# Patient Record
Sex: Female | Born: 1965 | Hispanic: Yes | Marital: Married | Smoking: Never smoker
Health system: Southern US, Community
[De-identification: ages and names within clinical notes are randomized; demographics above are authoritative.]

## PROBLEM LIST (undated history)

## (undated) DIAGNOSIS — E119 Type 2 diabetes mellitus without complications: Secondary | ICD-10-CM

## (undated) DIAGNOSIS — I10 Essential (primary) hypertension: Secondary | ICD-10-CM

## (undated) HISTORY — PX: COLPOSCOPY: SHX161

---

## 2011-11-11 ENCOUNTER — Other Ambulatory Visit (HOSPITAL_COMMUNITY): Payer: Self-pay | Admitting: Family Medicine

## 2011-11-11 DIAGNOSIS — Z1231 Encounter for screening mammogram for malignant neoplasm of breast: Secondary | ICD-10-CM

## 2011-12-09 ENCOUNTER — Ambulatory Visit (HOSPITAL_COMMUNITY): Payer: Self-pay

## 2011-12-10 ENCOUNTER — Ambulatory Visit (HOSPITAL_COMMUNITY): Admission: RE | Admit: 2011-12-10 | Payer: Self-pay | Source: Ambulatory Visit

## 2012-02-27 ENCOUNTER — Ambulatory Visit (HOSPITAL_COMMUNITY)
Admission: RE | Admit: 2012-02-27 | Discharge: 2012-02-27 | Disposition: A | Discharge status: Home / Self Care | Payer: Self-pay | Source: Ambulatory Visit | Attending: Family Medicine | Admitting: Family Medicine

## 2012-02-27 ENCOUNTER — Ambulatory Visit (HOSPITAL_COMMUNITY): Payer: Self-pay

## 2012-02-27 DIAGNOSIS — Z1231 Encounter for screening mammogram for malignant neoplasm of breast: Secondary | ICD-10-CM

## 2013-03-09 ENCOUNTER — Other Ambulatory Visit (HOSPITAL_COMMUNITY): Payer: Self-pay | Admitting: Family Medicine

## 2013-03-09 DIAGNOSIS — Z1231 Encounter for screening mammogram for malignant neoplasm of breast: Secondary | ICD-10-CM

## 2013-03-18 ENCOUNTER — Ambulatory Visit (HOSPITAL_COMMUNITY)
Admission: RE | Admit: 2013-03-18 | Discharge: 2013-03-18 | Disposition: A | Discharge status: Home / Self Care | Payer: Self-pay | Source: Ambulatory Visit | Attending: Family Medicine | Admitting: Family Medicine

## 2013-03-18 DIAGNOSIS — Z1231 Encounter for screening mammogram for malignant neoplasm of breast: Secondary | ICD-10-CM

## 2014-04-11 ENCOUNTER — Other Ambulatory Visit (HOSPITAL_COMMUNITY): Payer: Self-pay | Admitting: Family Medicine

## 2014-04-11 DIAGNOSIS — Z1231 Encounter for screening mammogram for malignant neoplasm of breast: Secondary | ICD-10-CM

## 2014-04-27 ENCOUNTER — Ambulatory Visit (HOSPITAL_COMMUNITY)
Admission: RE | Admit: 2014-04-27 | Discharge: 2014-04-27 | Disposition: A | Discharge status: Home / Self Care | Payer: Self-pay | Source: Ambulatory Visit | Attending: Family Medicine | Admitting: Family Medicine

## 2014-04-27 DIAGNOSIS — Z1231 Encounter for screening mammogram for malignant neoplasm of breast: Secondary | ICD-10-CM

## 2015-05-17 ENCOUNTER — Other Ambulatory Visit (HOSPITAL_COMMUNITY): Payer: Self-pay | Admitting: Family Medicine

## 2015-05-17 DIAGNOSIS — Z1231 Encounter for screening mammogram for malignant neoplasm of breast: Secondary | ICD-10-CM

## 2015-05-23 ENCOUNTER — Ambulatory Visit (HOSPITAL_COMMUNITY): Payer: Self-pay

## 2015-05-26 ENCOUNTER — Ambulatory Visit (HOSPITAL_COMMUNITY)
Admission: RE | Admit: 2015-05-26 | Discharge: 2015-05-26 | Disposition: A | Discharge status: Home / Self Care | Payer: Self-pay | Source: Ambulatory Visit | Attending: Family Medicine | Admitting: Family Medicine

## 2015-05-26 DIAGNOSIS — Z1231 Encounter for screening mammogram for malignant neoplasm of breast: Secondary | ICD-10-CM

## 2016-10-08 ENCOUNTER — Other Ambulatory Visit: Payer: Self-pay | Admitting: Family Medicine

## 2016-10-08 ENCOUNTER — Other Ambulatory Visit: Payer: Self-pay | Admitting: Internal Medicine

## 2016-10-08 DIAGNOSIS — Z1231 Encounter for screening mammogram for malignant neoplasm of breast: Secondary | ICD-10-CM

## 2016-12-10 ENCOUNTER — Ambulatory Visit
Admission: RE | Admit: 2016-12-10 | Discharge: 2016-12-10 | Disposition: A | Discharge status: Home / Self Care | Payer: No Typology Code available for payment source | Source: Ambulatory Visit | Attending: Family Medicine | Admitting: Family Medicine

## 2016-12-10 DIAGNOSIS — Z1231 Encounter for screening mammogram for malignant neoplasm of breast: Secondary | ICD-10-CM

## 2018-09-21 ENCOUNTER — Other Ambulatory Visit (HOSPITAL_COMMUNITY): Payer: Self-pay | Admitting: *Deleted

## 2018-09-21 DIAGNOSIS — Z1231 Encounter for screening mammogram for malignant neoplasm of breast: Secondary | ICD-10-CM

## 2018-12-29 ENCOUNTER — Ambulatory Visit (HOSPITAL_COMMUNITY): Payer: No Typology Code available for payment source

## 2019-01-06 ENCOUNTER — Other Ambulatory Visit (HOSPITAL_COMMUNITY): Payer: Self-pay | Admitting: *Deleted

## 2019-01-06 DIAGNOSIS — Z1231 Encounter for screening mammogram for malignant neoplasm of breast: Secondary | ICD-10-CM

## 2019-02-09 ENCOUNTER — Ambulatory Visit
Admission: RE | Admit: 2019-02-09 | Discharge: 2019-02-09 | Disposition: A | Discharge status: Home / Self Care | Payer: No Typology Code available for payment source | Source: Ambulatory Visit | Attending: Obstetrics and Gynecology | Admitting: Obstetrics and Gynecology

## 2019-02-09 ENCOUNTER — Encounter (HOSPITAL_COMMUNITY): Payer: Self-pay

## 2019-02-09 ENCOUNTER — Ambulatory Visit (HOSPITAL_COMMUNITY)
Admission: RE | Admit: 2019-02-09 | Discharge: 2019-02-09 | Disposition: A | Discharge status: Home / Self Care | Payer: No Typology Code available for payment source | Source: Ambulatory Visit | Attending: Obstetrics and Gynecology | Admitting: Obstetrics and Gynecology

## 2019-02-09 ENCOUNTER — Other Ambulatory Visit: Payer: Self-pay

## 2019-02-09 VITALS — BP 142/80 | Temp 97.7°F | Wt 174.0 lb

## 2019-02-09 DIAGNOSIS — Z1239 Encounter for other screening for malignant neoplasm of breast: Secondary | ICD-10-CM

## 2019-02-09 DIAGNOSIS — Z1231 Encounter for screening mammogram for malignant neoplasm of breast: Secondary | ICD-10-CM

## 2019-02-09 HISTORY — DX: Type 2 diabetes mellitus without complications: E11.9

## 2019-02-09 HISTORY — DX: Essential (primary) hypertension: I10

## 2019-02-09 NOTE — Patient Instructions (Signed)
Explained breast self awareness with Montez Morita. Patient did not need a Pap smear today due to last Pap smear was in 2018 per patient. Let her know [facility] will cover Pap smears every 3 years unless has a history of abnormal Pap smears. Referred patient to the [facility] for a screening mammogram. Appointment scheduled for Tuesday, February 08, 2018 at 1240. Patient aware of appointment and will be there. Let [facility] know [facility] will follow up with her within the next couple weeks with results of mammogram by letter or phone. Kortny Kryssa Risenhoover verbalized understanding.  Mackenize Delgadillo, Arvil Chaco, RN 12:11 PM

## 2019-02-09 NOTE — Progress Notes (Signed)
No complaints today.   Pap Smear: Pap smear not completed today. Last Pap smear was in 2018 at [facility] in [place] and normal per patient. Per patient has no history of an abnormal Pap smear. No Pap smear results are in Epic.  Physical exam: Breasts Breasts symmetrical. No skin abnormalities bilateral breasts. No nipple retraction bilateral breasts. No nipple discharge bilateral breasts. No lymphadenopathy. No lumps palpated bilateral breasts. No complaints of pain or tenderness on exam. [facility] for a screening mammogram. Appointment scheduled for Tuesday, February 08, 2018 at 1240.        Pelvic/Bimanual No Pap smear completed today since last Pap smear was in 2018 per patient. Pap smear not indicated per [facility] guidelines.   Smoking History: Patient has never smoked.  Patient Navigation: Patient education provided. Access to services provided for patient through [facility] program. Spanish interpreter provided.   Colorectal Cancer Screening: Per patient had a colonoscopy completed in 2018. No complaints today.   Breast and Cervical Cancer Risk Assessment: Patient has no family history of breast cancer, known genetic mutations, or radiation treatment to the chest before age 68. Patient has no history of cervical dysplasia, immunocompromised, or DES exposure in-utero.  Risk Assessment    Risk Scores      02/09/2019   Last edited by: Armond Hang, LPN   5-year risk: 0.5 %   Lifetime risk: 4 %         Used Spanish interpreter Rudene Anda from [place].

## 2019-02-10 ENCOUNTER — Other Ambulatory Visit: Payer: Self-pay | Admitting: Obstetrics and Gynecology

## 2019-02-10 ENCOUNTER — Encounter (HOSPITAL_COMMUNITY): Payer: Self-pay | Admitting: *Deleted

## 2019-02-10 DIAGNOSIS — R928 Other abnormal and inconclusive findings on diagnostic imaging of breast: Secondary | ICD-10-CM

## 2019-02-16 ENCOUNTER — Ambulatory Visit
Admission: RE | Admit: 2019-02-16 | Discharge: 2019-02-16 | Disposition: A | Discharge status: Home / Self Care | Payer: No Typology Code available for payment source | Source: Ambulatory Visit | Attending: Obstetrics and Gynecology | Admitting: Obstetrics and Gynecology

## 2019-02-16 ENCOUNTER — Other Ambulatory Visit: Payer: Self-pay

## 2019-02-16 DIAGNOSIS — R928 Other abnormal and inconclusive findings on diagnostic imaging of breast: Secondary | ICD-10-CM

## 2020-04-01 IMAGING — MG DIGITAL DIAGNOSTIC UNILATERAL RIGHT MAMMOGRAM WITH TOMO AND CAD
4 series · 4 of 12 positions shown · non-contrast
Comparison: Previous exam(s).

CLINICAL DATA: Recall from screening mammography with
tomosynthesis, possible focal asymmetry involving the OUTER
periareolar RIGHT breast.

EXAM:
DIGITAL DIAGNOSTIC RIGHT MAMMOGRAM WITH TOMO
ULTRASOUND RIGHT BREAST

[R MLO synth-2D]
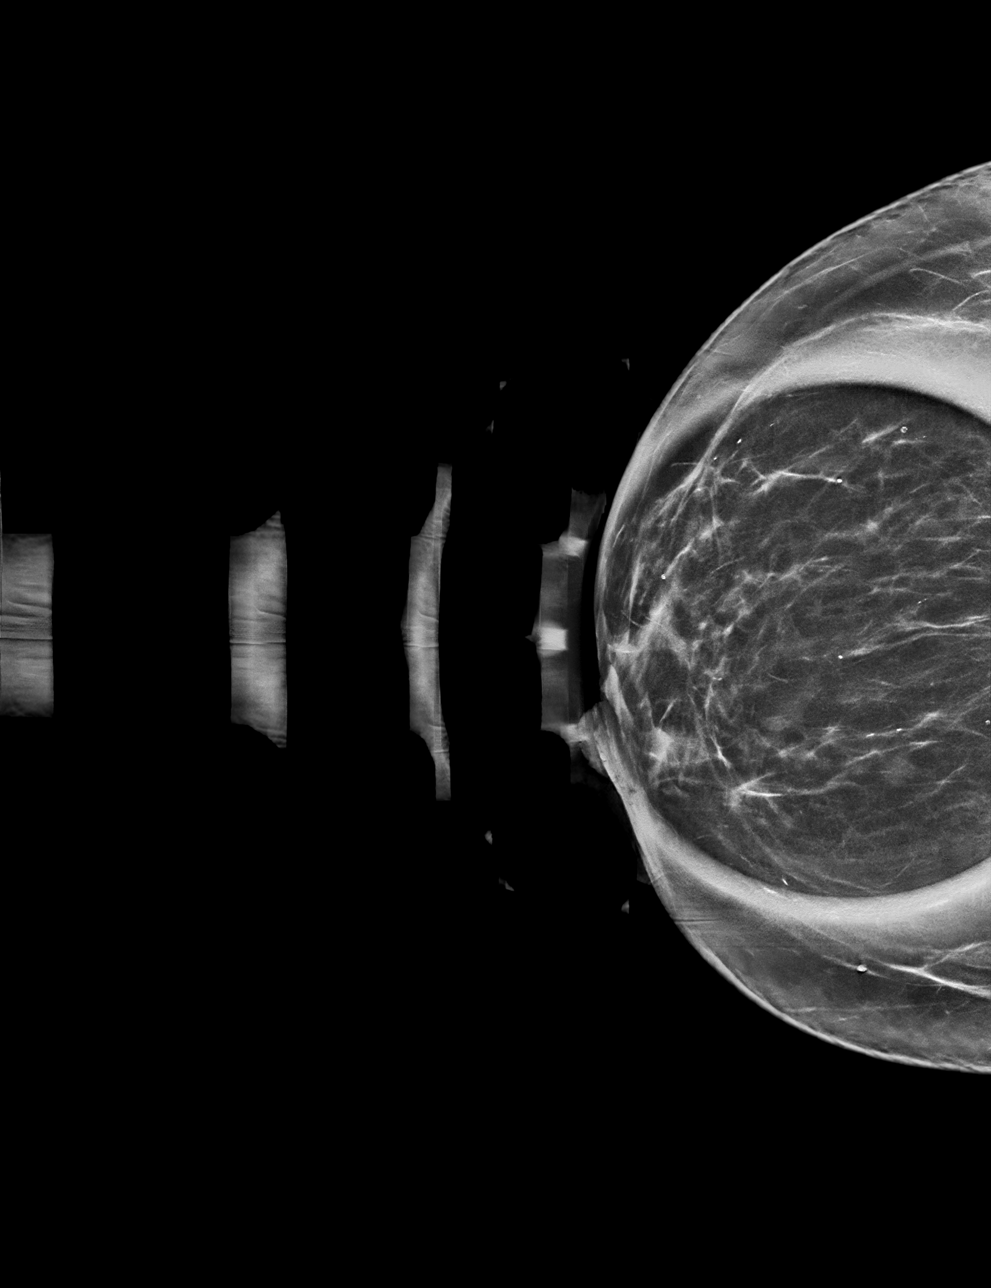

[R CC synth-2D]
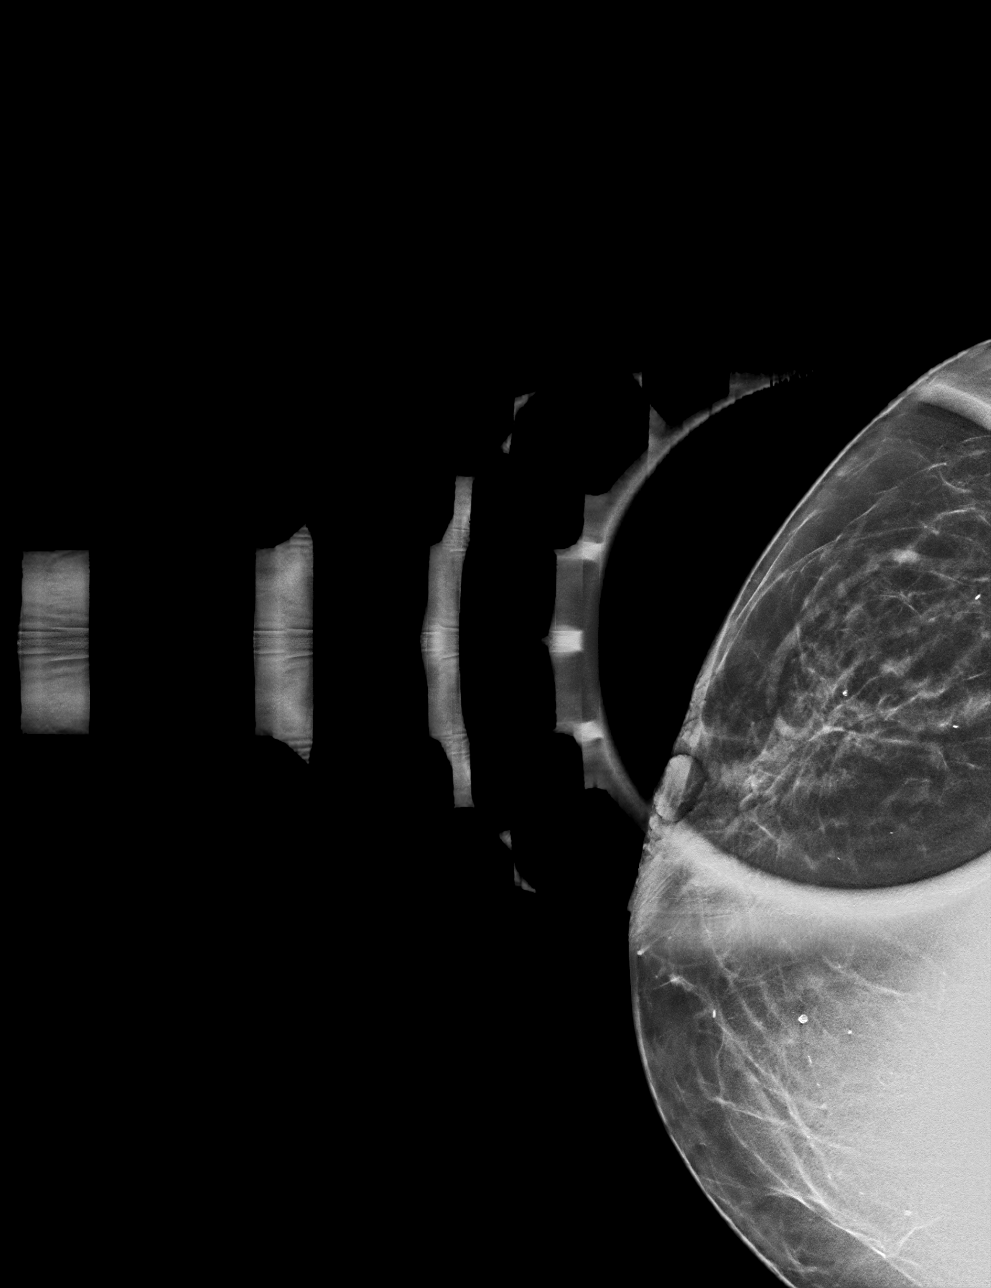

[R CC tomo · tomo slice 25/48.0]
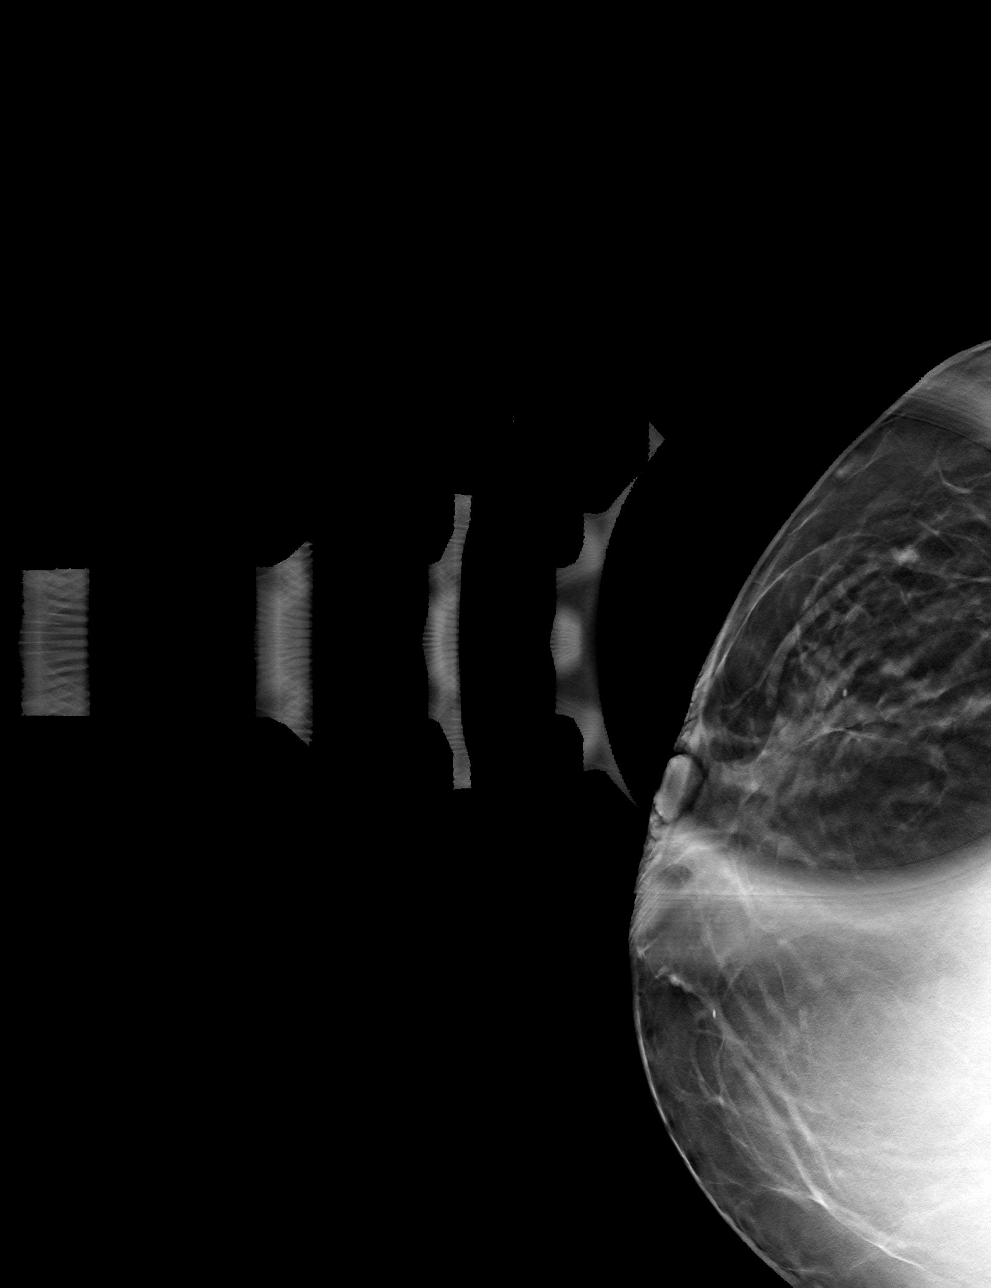

[R MLO tomo · tomo slice 35/68.0]
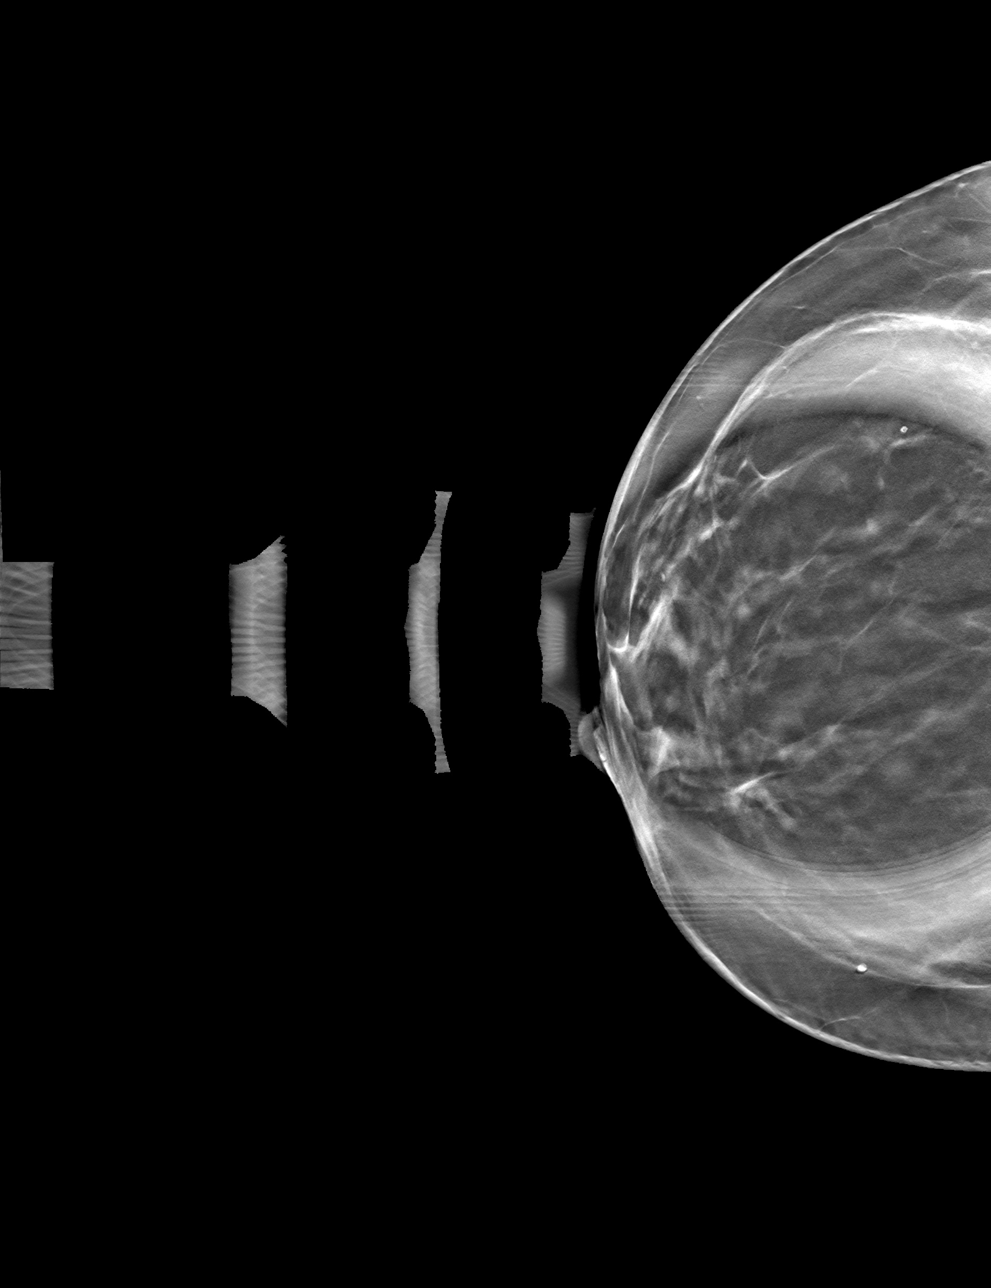

[4 of 12 positions shown; findings below may reference images not displayed]

ACR Breast Density Category b: There are scattered areas of
fibroglandular density.
FINDINGS: Tomosynthesis and synthesized spot-compression CC and MLO views of
the area of concern in the RIGHT breast were obtained.

The focal asymmetry questioned on screening mammography disperses
with compression and there is no underlying mass or architectural
distortion.

Targeted RIGHT breast ultrasound is performed, showing normal
scattered fibroglandular tissue at the 9 o'clock periareolar
location corresponding to the mammographic finding. No cyst, solid
mass, intraductal mass or abnormal acoustic shadowing is identified.
IMPRESSION: 1. No mammographic or sonographic evidence of malignancy involving
the RIGHT breast.
2. Scattered fibroglandular tissue in the OUTER periareolar location
accounts for the screening mammographic finding.

RECOMMENDATION:
Screening mammogram in one year.(Code:U4-8-MPF)

I have discussed the findings and recommendations with the patient.
Communication with the patient was achieved with the assistance of a
certified interpreter. If applicable, a reminder letter will be sent
to the patient regarding the next appointment.

BI-RADS CATEGORY  1: Negative.

## 2020-04-01 IMAGING — US ULTRASOUND RIGHT BREAST LIMITED
1 series · 5 of 5 positions shown · non-contrast
Comparison: Previous exam(s).

CLINICAL DATA: Recall from screening mammography with
tomosynthesis, possible focal asymmetry involving the OUTER
periareolar RIGHT breast.

EXAM:
DIGITAL DIAGNOSTIC RIGHT MAMMOGRAM WITH TOMO
ULTRASOUND RIGHT BREAST

[Series 1: ultrasound right breast limited · 0.08mm/px · 5 of 5 slices shown]
[im 1/5]
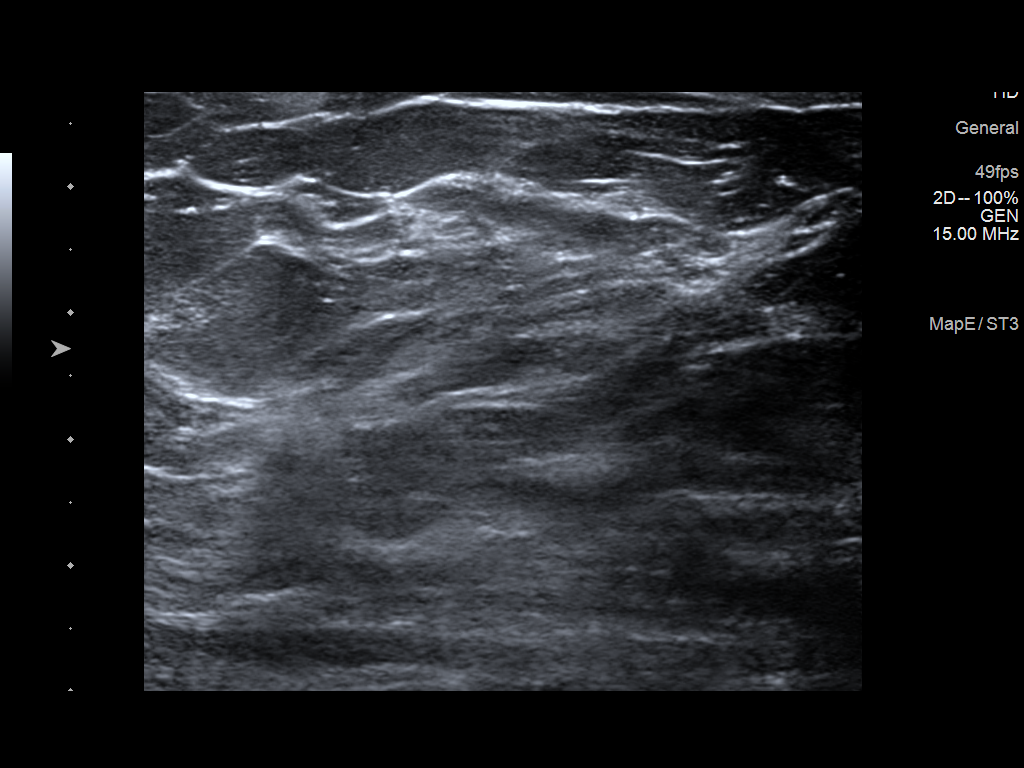
[im 2/5]
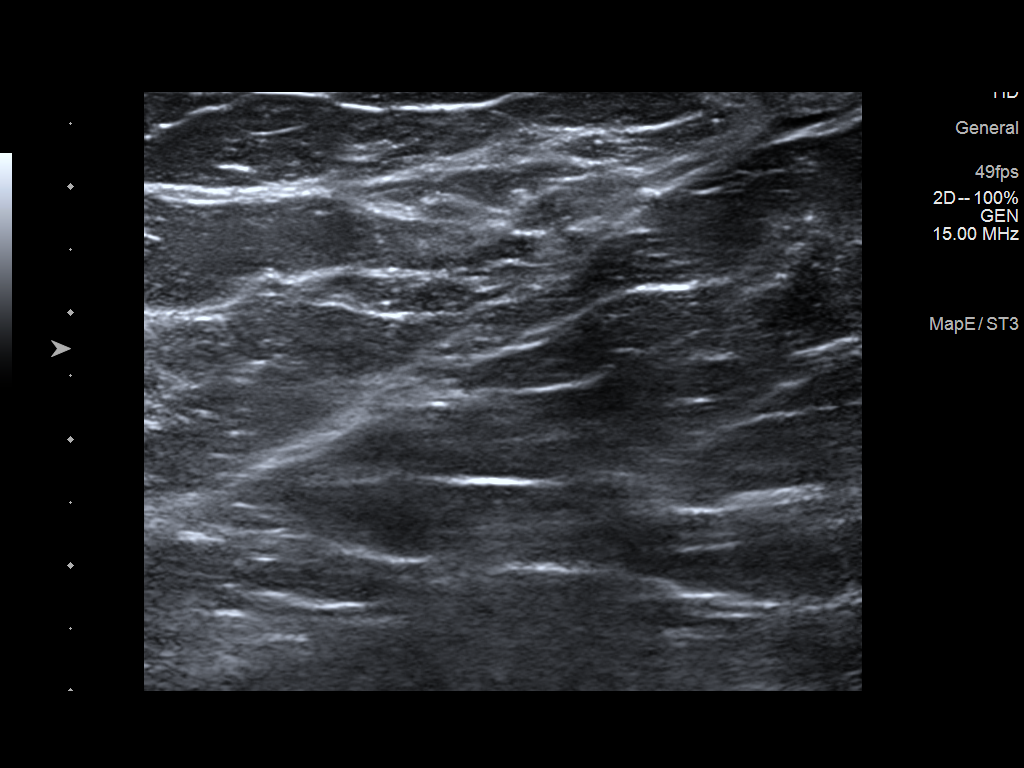
[im 3/5]
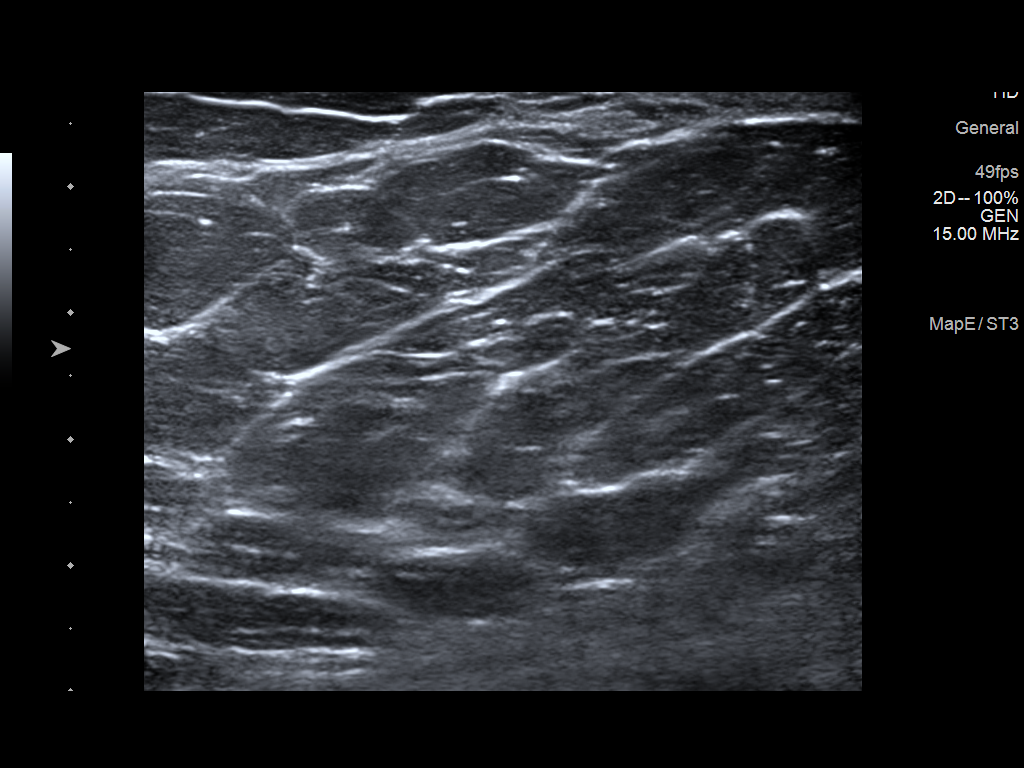
[im 4/5]
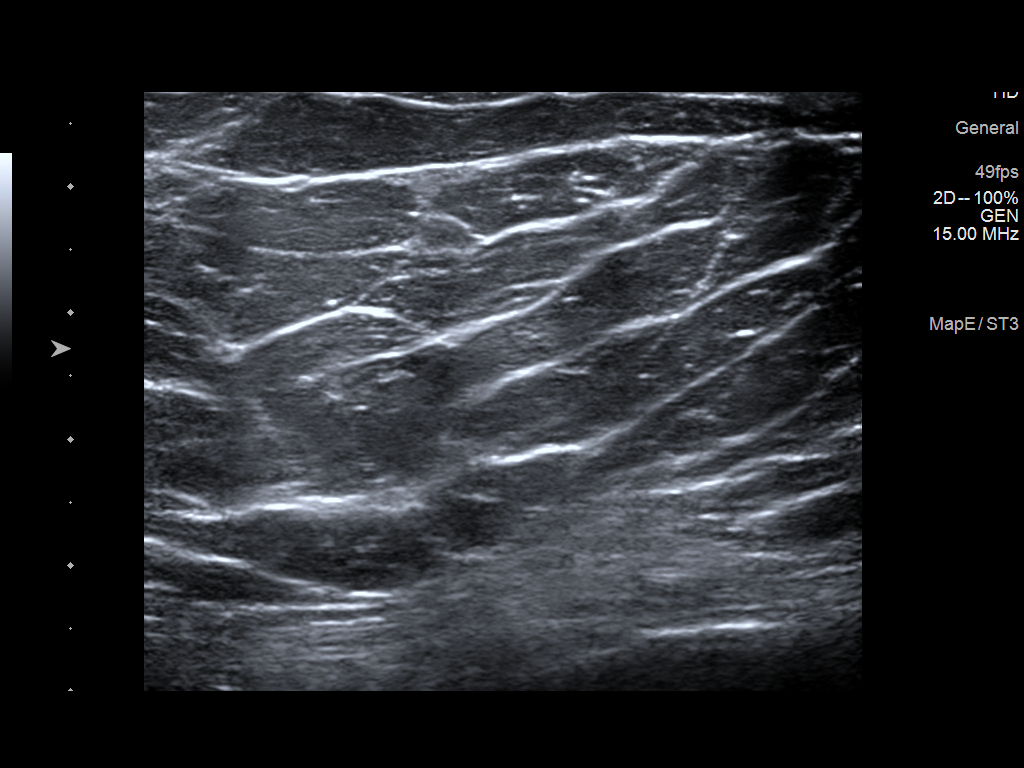
[im 5/5]
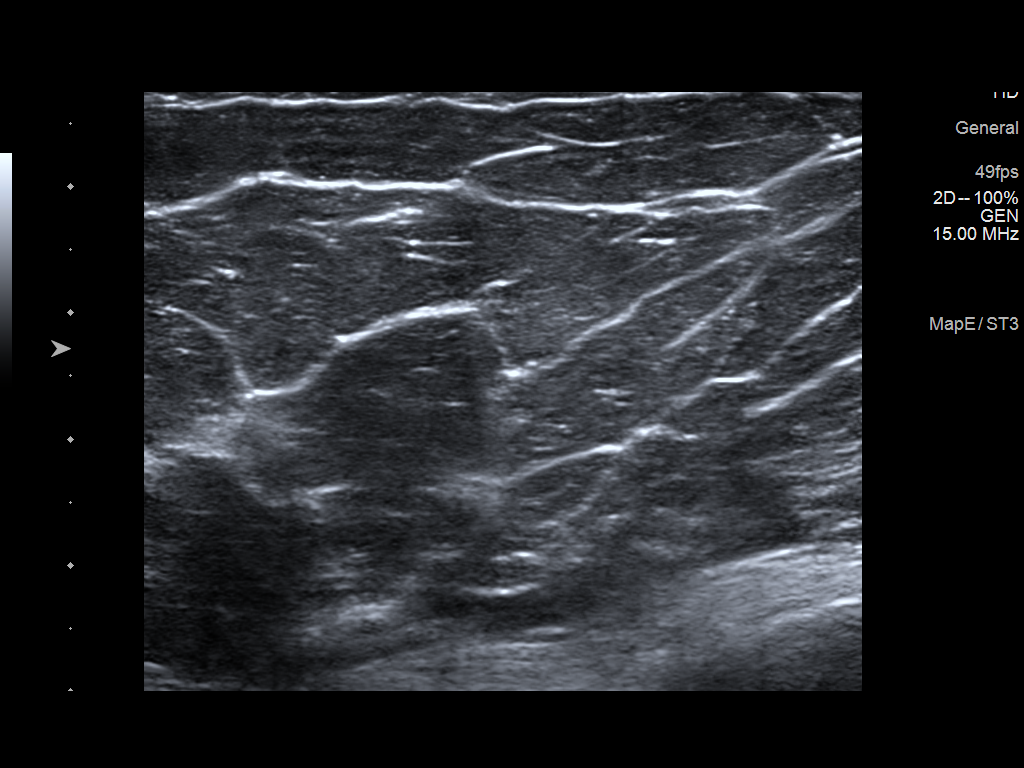

[5 of 5 positions shown; findings below may reference images not displayed]

ACR Breast Density Category b: There are scattered areas of
fibroglandular density.
FINDINGS: Tomosynthesis and synthesized spot-compression CC and MLO views of
the area of concern in the RIGHT breast were obtained.

The focal asymmetry questioned on screening mammography disperses
with compression and there is no underlying mass or architectural
distortion.

Targeted RIGHT breast ultrasound is performed, showing normal
scattered fibroglandular tissue at the 9 o'clock periareolar
location corresponding to the mammographic finding. No cyst, solid
mass, intraductal mass or abnormal acoustic shadowing is identified.
IMPRESSION: 1. No mammographic or sonographic evidence of malignancy involving
the RIGHT breast.
2. Scattered fibroglandular tissue in the OUTER periareolar location
accounts for the screening mammographic finding.

RECOMMENDATION:
Screening mammogram in one year.(Code:U4-8-MPF)

I have discussed the findings and recommendations with the patient.
Communication with the patient was achieved with the assistance of a
certified interpreter. If applicable, a reminder letter will be sent
to the patient regarding the next appointment.

BI-RADS CATEGORY  1: Negative.
